# Patient Record
Sex: Female | Born: 2005 | Race: Black or African American | Hispanic: No | Marital: Single | Smoking: Never smoker
Health system: Southern US, Community
[De-identification: ages and names within clinical notes are randomized; demographics above are authoritative.]

## PROBLEM LIST (undated history)

## (undated) ENCOUNTER — Inpatient Hospital Stay (HOSPITAL_COMMUNITY): Payer: Self-pay

## (undated) HISTORY — PX: TONSILLECTOMY: SUR1361

## (undated) HISTORY — PX: WISDOM TOOTH EXTRACTION: SHX21

---
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
## 2024-01-17 ENCOUNTER — Other Ambulatory Visit: Payer: Self-pay | Admitting: Family

## 2024-01-17 DIAGNOSIS — N644 Mastodynia: Secondary | ICD-10-CM

## 2024-01-17 DIAGNOSIS — N6489 Other specified disorders of breast: Secondary | ICD-10-CM

## 2024-02-08 ENCOUNTER — Ambulatory Visit
Admission: RE | Admit: 2024-02-08 | Discharge: 2024-02-08 | Disposition: A | Discharge status: Home / Self Care | Source: Ambulatory Visit | Attending: Family | Admitting: Family

## 2024-02-08 ENCOUNTER — Other Ambulatory Visit: Payer: Self-pay | Admitting: Family

## 2024-02-08 DIAGNOSIS — N644 Mastodynia: Secondary | ICD-10-CM

## 2024-02-08 DIAGNOSIS — N6452 Nipple discharge: Secondary | ICD-10-CM

## 2024-02-08 DIAGNOSIS — N6489 Other specified disorders of breast: Secondary | ICD-10-CM

## 2024-03-01 ENCOUNTER — Encounter (INDEPENDENT_AMBULATORY_CARE_PROVIDER_SITE_OTHER): Payer: Self-pay

## 2024-03-01 ENCOUNTER — Institutional Professional Consult (permissible substitution)

## 2024-03-01 VITALS — BP 122/83 | HR 77 | Ht 65.0 in | Wt 231.0 lb

## 2024-03-01 DIAGNOSIS — M546 Pain in thoracic spine: Secondary | ICD-10-CM | POA: Diagnosis not present

## 2024-03-01 DIAGNOSIS — M542 Cervicalgia: Secondary | ICD-10-CM

## 2024-03-01 DIAGNOSIS — N62 Hypertrophy of breast: Secondary | ICD-10-CM | POA: Diagnosis not present

## 2024-03-01 DIAGNOSIS — R21 Rash and other nonspecific skin eruption: Secondary | ICD-10-CM | POA: Diagnosis not present

## 2024-03-01 DIAGNOSIS — M545 Low back pain, unspecified: Secondary | ICD-10-CM

## 2024-03-01 NOTE — Progress Notes (Signed)
 BREAST REDUCTION CONSULT Plastic & Reconstructive Surgery New Patient Visit  Patient: Anna Bird MRN: [id number] Date: 03/01/2024 Referring Physician: [facility]  Chief Complaint: Symptomatic macromastia, cervicalgia  History of Present Illness:  This is a 19 y.o. woman with PMH and PSH as described below who presents in consultation for breast reduction.   The patient states she has been considering a breast reduction for years. She describes intermittent skin irritation in the breast folds and occasional rashes.   Back pain in the upper and lower back, including neck pain. She pulls or pins her bra straps to provide better lift and relief of the pressure and pain. She notices relief by holding her breast up manually.  Her shoulder straps cause grooves and pain and pressure that requires padding for relief. Pain medication is sometimes required with motrin  and tylenol .  Activities that are hindered by enlarged breasts include: exercise and running.  She has tried supportive clothing as well as fitted bras without improvement.  She currently wears a DDD cup bra and would ideally like to be a C cup.   She has no personal history of breast abnormalities and has never had any breast biopsies or surgeries. Has not had a mammogram due to age.   She no recent weight changes. Of note, she has not breastfed children and is not done child bearing.   Past Medical History: No past medical history on file.  Past Surgical History: Past Surgical History:  Procedure Laterality Date   TONSILLECTOMY     WISDOM TOOTH EXTRACTION Bilateral    Current Medications: Medications Ordered Prior to Encounter[1] Allergies: Allergies[2]  Family History:  History of breast cancer in mathernal aunt. Otherwise, family history is negative for bleeding/clotting disorders, problems with anesthesia, connective tissue disorders.   Social History:  Social History   Socioeconomic  History   Marital status: Single    Spouse name: Not on file   Number of children: Not on file   Years of education: Not on file   Highest education level: Not on file  Occupational History   Not on file  Tobacco Use   Smoking status: Never    Passive exposure: Never   Smokeless tobacco: Never  Vaping Use   Vaping status: Never Used  Substance and Sexual Activity   Alcohol use: Never   Drug use: Not Currently    Comment: Gummies at school, 09-2022   Sexual activity: Yes    Birth control/protection: Condom    Comment: Last encounter: [id number]  Other Topics Concern   Not on file  Social History Narrative   Not on file   Social Drivers of Health   Tobacco Use: Low Risk (12/14/2023)   Received from [facility]   Patient History    Smoking Tobacco Use: Never    Smokeless Tobacco Use: Never    Passive Exposure: Not on file  Financial Resource Strain: Not on File (05/14/2021)   Received from [facility]    Financial Resource Strain: 0  Food Insecurity: Low Risk (11/10/2023)   Received from [facility]   Epic    Within the past 12 months, you worried that your food would run out before you got money to buy more: Never true    Within the past 12 months, the food you bought just didn't last and you didn't have money to get more. : Never true  Transportation Needs: No Transportation Needs (11/10/2023)   Received from [facility]   Transportation    In the past 12 months, has lack of reliable transportation kept you from medical appointments, meetings, work or from getting things needed for daily living? : No  Physical Activity: Not on File (05/14/2021)   Received from [facility]   Physical Activity    Physical Activity: 0  Stress: Not on File (05/14/2021)   Received from [facility]   Stress    Stress: 0  Social Connections: Not on File (10/09/2022)   Received from [facility]   Social Connections    Connectedness: 0  Depression (PHQ2-9): Not on file  Alcohol  Screen: Not on file  Housing: Low Risk (11/10/2023)   Received from [facility]   Epic    What is your living situation today?: I have a steady place to live    Think about the place you live. Do you have problems with any of the following? Choose all that apply:: None/None on this list  Utilities: Low Risk (11/10/2023)   Received from [facility]   Utilities    In the past 12 months has the electric, gas, oil, or water company threatened to shut off services in your home? : No  Health Literacy: Not on file   Smoker: Denies Recreational drug use: Dnies  Review of systems: 10 point review of systems performed and negative except as noted in the HPI  Physical Exam: BP 122/83 (BP Location: Left Arm, Patient Position: Sitting, Cuff Size: Large)   Pulse 77   Ht 5' 5 (1.651 m)   Wt 231 lb (104.8 kg)   LMP 06/04/2023 (Exact Date)   SpO2 97%   BMI 38.44 kg/m  Body mass index is 38.44 kg/m.  Physical Exam           General: Well appearing, no apparent distress. Chest: Chest wall without abnormality or obvious deformity. No scoliosis, pectus excavatum or pectus carinatum. Breast: Bilateral macromastia. Grade 3 ptosis bilaterally. Breast parenchyma is fatty. There are no palpable breast masses in either breast. Bilateral NAC viable and sensate. Papules everted without discharge. NACs positioned along breast meridian bilaterally. Skin quality is poor. There are no skin lesions, scars or dimpling.   Assessment: In summary, this is a pleasant 19 y.o. year-old woman presenting for consultation for bilateral breast reduction in setting of symptomatic macromastia.   We discussed that the patient would only be a candidate for breast reduction surgery should she lose 15-20 pounds and maintain a stable weight >3 months. The patient voiced understanding throughout our discussion today. All questions and concerns were addressed to the patient's apparent satisfaction.   We discussed the risks  of this procedure which include but are not limited to: bleeding, infection, seroma, delayed wound healing, wound dehiscence, asymmetries, fat necrosis, hypertrophic and keloid scarring, decreased or loss of nipple sensation, partial or full loss of the NAC, numbness, paresthesias, injuries to arteries/nerves/veins, need for revision procedures, further out-of-pocket expenses for ongoing medical care, and potential need for repeat reduction in the future. We discussed that pregnancy can alter the size and shape of her breasts and revision procedures may be required after pregnancy. We discussed that while she has the same potential to breast feed as a woman who has never had a breast reduction, she may have less milk production and may require formula supplementation. We further discussed the risk of DVT/PE, fat embolism, heart attack, stroke, death as well as the risks of anesthesia. We reviewed the expected recovery period with no heavy activities or  lifting >5lbs for 6 weeks postoperatively.   Plan: - Healthy weight and wellness  - Follow up with me in 3 months  The sensitive parts of the examination/procedure were performed with MA as chaperone.  The time documented represents the total time spent on the day of the encounter in preparing for and completing the visit. It does not include time spent by ancillary staff, a resident, a fellow, another trainee, or, for shared visits, time spent jointly with the patient or discussing the case or the performance of other separately performed services.  Time spent: 45 minutes.     Takasha Vetere, MD [facility]  03/01/2024 3:48 PM     [1]  Current Outpatient Medications on File Prior to Visit  Medication Sig Dispense Refill   Vitamin D, Ergocalciferol, (DRISDOL) 1.25 MG (50000 UNIT) CAPS capsule Take 50,000 Units by mouth every 7 (seven) days.     No current facility-administered medications on file prior to visit.   [2] No Known Allergies

## 2024-03-06 ENCOUNTER — Institutional Professional Consult (permissible substitution) (INDEPENDENT_AMBULATORY_CARE_PROVIDER_SITE_OTHER): Admitting: Nurse Practitioner

## 2024-06-01 ENCOUNTER — Ambulatory Visit
